# Patient Record
Sex: Male | Born: 1970 | Race: Asian | Hispanic: No | Marital: Married | Smoking: Never smoker
Health system: Southern US, Community
[De-identification: ages and names within clinical notes are randomized; demographics above are authoritative.]

## PROBLEM LIST (undated history)

## (undated) DIAGNOSIS — J309 Allergic rhinitis, unspecified: Secondary | ICD-10-CM

## (undated) DIAGNOSIS — I10 Essential (primary) hypertension: Secondary | ICD-10-CM

## (undated) DIAGNOSIS — J45909 Unspecified asthma, uncomplicated: Secondary | ICD-10-CM

---

## 2018-08-30 DIAGNOSIS — G4733 Obstructive sleep apnea (adult) (pediatric): Secondary | ICD-10-CM | POA: Insufficient documentation

## 2018-08-30 DIAGNOSIS — J4530 Mild persistent asthma, uncomplicated: Secondary | ICD-10-CM | POA: Insufficient documentation

## 2018-08-30 DIAGNOSIS — J309 Allergic rhinitis, unspecified: Secondary | ICD-10-CM | POA: Insufficient documentation

## 2018-12-27 ENCOUNTER — Ambulatory Visit: Payer: Self-pay | Admitting: Family Medicine

## 2019-10-30 DIAGNOSIS — I10 Essential (primary) hypertension: Secondary | ICD-10-CM | POA: Insufficient documentation

## 2019-10-30 DIAGNOSIS — R7303 Prediabetes: Secondary | ICD-10-CM | POA: Insufficient documentation

## 2019-10-30 DIAGNOSIS — I447 Left bundle-branch block, unspecified: Secondary | ICD-10-CM | POA: Insufficient documentation

## 2019-11-04 DIAGNOSIS — D569 Thalassemia, unspecified: Secondary | ICD-10-CM | POA: Insufficient documentation

## 2019-11-11 ENCOUNTER — Emergency Department: Payer: Managed Care, Other (non HMO)

## 2019-11-11 ENCOUNTER — Emergency Department
Admission: EM | Admit: 2019-11-11 | Discharge: 2019-11-11 | Disposition: A | Discharge status: Home / Self Care | Payer: Managed Care, Other (non HMO) | Attending: Emergency Medicine | Admitting: Emergency Medicine

## 2019-11-11 ENCOUNTER — Encounter: Payer: Self-pay | Admitting: Emergency Medicine

## 2019-11-11 ENCOUNTER — Other Ambulatory Visit: Payer: Self-pay

## 2019-11-11 DIAGNOSIS — R079 Chest pain, unspecified: Secondary | ICD-10-CM | POA: Diagnosis present

## 2019-11-11 DIAGNOSIS — J45909 Unspecified asthma, uncomplicated: Secondary | ICD-10-CM | POA: Insufficient documentation

## 2019-11-11 DIAGNOSIS — I10 Essential (primary) hypertension: Secondary | ICD-10-CM | POA: Insufficient documentation

## 2019-11-11 HISTORY — DX: Unspecified asthma, uncomplicated: J45.909

## 2019-11-11 HISTORY — DX: Allergic rhinitis, unspecified: J30.9

## 2019-11-11 HISTORY — DX: Essential (primary) hypertension: I10

## 2019-11-11 LAB — BASIC METABOLIC PANEL
Anion gap: 6 (ref 5–15)
BUN: 8 mg/dL (ref 6–20)
CO2: 29 mmol/L (ref 22–32)
Calcium: 9.7 mg/dL (ref 8.9–10.3)
Chloride: 106 mmol/L (ref 98–111)
Creatinine, Ser: 0.67 mg/dL (ref 0.61–1.24)
GFR calc Af Amer: >60 mL/min (ref >60)
GFR calc non Af Amer: >60 mL/min (ref >60)
Glucose, Bld: 114 mg/dL — ABNORMAL HIGH (ref 70–99)
Potassium: 4.1 mmol/L (ref 3.5–5.1)
Sodium: 141 mmol/L (ref 135–145)

## 2019-11-11 LAB — CBC
HCT: 35.7 % — ABNORMAL LOW (ref 39.0–52.0)
Hemoglobin: 11.3 g/dL — ABNORMAL LOW (ref 13.0–17.0)
MCH: 18.2 pg — ABNORMAL LOW (ref 26.0–34.0)
MCHC: 31.7 g/dL (ref 30.0–36.0)
MCV: 57.4 fL — ABNORMAL LOW (ref 80.0–100.0)
Platelets: 332 10*3/uL (ref 150–400)
RBC: 6.22 MIL/uL — ABNORMAL HIGH (ref 4.22–5.81)
RDW: 18.8 % — ABNORMAL HIGH (ref 11.5–15.5)
WBC: 7 10*3/uL (ref 4.0–10.5)
nRBC: 0 % (ref 0.0–0.2)

## 2019-11-11 LAB — TROPONIN I (HIGH SENSITIVITY)
Troponin I (High Sensitivity): 5 ng/L (ref <18)
Troponin I (High Sensitivity): 7 ng/L (ref <18)

## 2019-11-11 MED ORDER — NITROGLYCERIN 0.4 MG SL SUBL
0.4000 mg | SUBLINGUAL_TABLET | SUBLINGUAL | Status: DC | PRN
  Administered 2019-11-11 (×2): 0.4 mg via SUBLINGUAL
  Filled 2019-11-11 (×2): qty 1

## 2019-11-11 MED ORDER — ASPIRIN 81 MG PO CHEW
324.0000 mg | CHEWABLE_TABLET | Freq: Once | ORAL | Status: AC
  Administered 2019-11-11: 324 mg via ORAL
  Filled 2019-11-11: qty 4

## 2019-11-11 MED ORDER — ACETAMINOPHEN 500 MG PO TABS
1000.0000 mg | ORAL_TABLET | Freq: Once | ORAL | Status: AC
  Administered 2019-11-11: 1000 mg via ORAL
  Filled 2019-11-11: qty 2

## 2019-11-11 NOTE — ED Provider Notes (Signed)
[facility] Emergency Department Provider Note  ____________________________________________  Time seen: Approximately 7:52 PM  I have reviewed the triage vital signs and the nursing notes.   HISTORY  Chief Complaint Chest Pain    HPI George Rivera is a 49 y.o. male with a history of asthma no aggravating or alleviating factors.  Not exertional, not pleuritic.  Not positional.  Feels like pressure, nonradiating.  No associated diaphoresis vomiting shortness of breath palpitations dizziness or syncope.  He walks for about an hour each day for exercise, which does not provoke chest pain.  Electronic medical record reviewed, noted that he was recently seen by cardiology due to incidental finding of left bundle branch block.  Plan for echocardiogram and exercise myocardial perfusion study in about 2 weeks.  Takes omeprazole daily.      Past Medical History:  Diagnosis Date  . Allergic rhinitis   . Asthma   . Hypertension      There are no problems to display for this patient.    History reviewed. No pertinent surgical history.   Prior to Admission medications   Not on File  Omeprazole  Simethicone   Allergies Patient has no known allergies.   Family history Left bundle branch block  Social History Social History   Tobacco Use  . Smoking status: Never Smoker  . Smokeless tobacco: Never Used  Substance Use Topics  . Alcohol use: Never  . Drug use: Not on file    Review of Systems  Constitutional:   No fever or chills.  ENT:   No sore throat. No rhinorrhea. Cardiovascular:   Positive chest pain as above without syncope. Respiratory:   No dyspnea or cough. Gastrointestinal:   Negative for abdominal pain, vomiting and diarrhea.  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  ____________________________________________   PHYSICAL EXAM:  VITAL SIGNS: ED Triage Vitals  Enc Vitals Group     BP 11/11/19 1409 (!) 166/105     Pulse Rate 11/11/19 1409 82     Resp 11/11/19 1409 16     Temp 11/11/19 1409 98.8 F (37.1 C)     Temp Source 11/11/19 1409 Oral     SpO2 11/11/19 1409 100 %     Weight 11/11/19 1411 162 lb (73.5 kg)     Height 11/11/19 1411 5\' 3"  (1.6 m)     Head Circumference --      Peak Flow --      Pain Score 11/11/19 1410 5     Pain Loc --      Pain Edu? --      Excl. in GC? --     Vital signs reviewed, nursing assessments reviewed.   Constitutional:   Alert and oriented. Non-toxic appearance. Eyes:   Conjunctivae are normal. EOMI. PERRL. ENT      Head:   Normocephalic and atraumatic.      Nose:   Wearing a mask.      Mouth/Throat:   Wearing a mask.      Neck:   No meningismus. Full ROM. Hematological/Lymphatic/Immunilogical:   No cervical lymphadenopathy. Cardiovascular:   RRR. Symmetric bilateral radial and DP pulses.  No murmurs. Cap refill less than 2 seconds. Respiratory:   Normal respiratory effort without tachypnea/retractions. Breath sounds are clear and equal bilaterally. No wheezes/rales/rhonchi. Gastrointestinal:   Soft and nontender. Non distended. There is  no CVA tenderness.  No rebound, rigidity, or guarding.  Musculoskeletal:   Normal range of motion in all extremities. No joint effusions.  No lower extremity tenderness.  No edema.  Left upper chest wall tender over the third intercostal space at the anterior axillary line reproducing his pain.  No pain with rotator cuff stress. Neurologic:   Normal speech and language.  Motor grossly intact. No acute focal neurologic deficits are appreciated.  Skin:    Skin is warm, dry and intact. No rash noted.  No petechiae, purpura, or bullae.  ____________________________________________    LABS (pertinent positives/negatives) (all labs ordered are listed, but only  abnormal results are displayed) Labs Reviewed  BASIC METABOLIC PANEL - Abnormal; Notable for the following components:      Result Value   Glucose, Bld 114 (*)    All other components within normal limits  CBC - Abnormal; Notable for the following components:   RBC 6.22 (*)    Hemoglobin 11.3 (*)    HCT 35.7 (*)    MCV 57.4 (*)    MCH 18.2 (*)    RDW 18.8 (*)    All other components within normal limits  TROPONIN I (HIGH SENSITIVITY)  TROPONIN I (HIGH SENSITIVITY)   ____________________________________________   EKG  Interpreted by me Normal sinus rhythm rate of 86.  Normal axis, left bundle branch block with repolarization abnormalities.  No STEMI..  ____________________________________________    RADIOLOGY  DG Chest 2 View  Result Date: 11/11/2019 CLINICAL DATA:  Chest pain. EXAM: CHEST - 2 VIEW COMPARISON:  Report from chest radiograph 10/29/2019 (images unavailable). FINDINGS: Shallow inspiration radiographs. Heart size within normal limits. There is no appreciable airspace consolidation. No evidence of pleural effusion or pneumothorax. No acute bony abnormality identified.  Thoracic spondylosis. IMPRESSION: Shallow inspiration radiographs. No evidence of acute cardiopulmonary abnormality. Electronically Signed   By: Kellie Simmering DO   On: 11/11/2019 14:46    ____________________________________________   PROCEDURES Procedures  ____________________________________________  DIFFERENTIAL DIAGNOSIS   Musculoskeletal chest wall pain, non-STEMI, GERD, myocarditis  CLINICAL IMPRESSION / ASSESSMENT AND PLAN / ED COURSE  Medications ordered in the ED: Medications  nitroGLYCERIN (NITROSTAT) SL tablet 0.4 mg (0.4 mg Sublingual Given 11/11/19 1935)  acetaminophen (TYLENOL) tablet 1,000 mg (1,000 mg Oral Given 11/11/19 1924)  aspirin chewable tablet 324 mg (324 mg Oral Given 11/11/19 1925)    Pertinent labs & imaging results that were available during my care of the  patient were reviewed by me and considered in my medical decision making (see chart for details).  George Rivera was evaluated in Emergency Department on 11/11/2019 for the symptoms described in the history of present illness. He was evaluated in the context of the global COVID-19 pandemic, which necessitated consideration that the patient might be at risk for infection with the SARS-CoV-2 virus that causes COVID-19. Institutional protocols and algorithms that pertain to the evaluation of patients at risk for COVID-19 are in a state of rapid change based on information released by regulatory bodies including the CDC and federal and state organizations. These policies and algorithms were followed during the patient's care in the ED.   Patient presents with atypical chest pain, unlikely cardiac.  However with elevated BMI, history of hypertension, will check serial troponins.  If negative I think patient can follow-up with cardiology for outpatient screening work-up as already planned.  In the ED, EKG is consistent with previous findings, chest x-ray unremarkable, initial labs including troponin are normal.  We will  give aspirin and nitroglycerin as a therapeutic trial to see if this relieves his pain.  Recommend he take NSAIDs and use heat therapy at home if second troponin is negative.  Clinical Course as of Nov 10 2025  Mon Nov 11, 2019  2026 Repeat troponin normal, stable for discharge home.   [PS]    Clinical Course User Index [PS] Sharman Cheek, MD     ____________________________________________   FINAL CLINICAL IMPRESSION(S) / ED DIAGNOSES    Final diagnoses:  Nonspecific chest pain     ED Discharge Orders    None      Portions of this note were generated with dragon dictation software. Dictation errors may occur despite best attempts at proofreading.   Sharman Cheek, MD 11/11/19 2027

## 2019-11-11 NOTE — ED Triage Notes (Signed)
Patient presents to the ED with left sided chest pain since yesterday.  Patient reports history of LBBB.  Patient states he took tums and simethicone without relief.  Patient states the pain radiates somewhat to his left side.  Patient states, "I may have pulled a muscle.  I've been walking a lot."  Denies increased pain with movement.

## 2019-11-18 ENCOUNTER — Ambulatory Visit: Payer: Managed Care, Other (non HMO) | Admitting: Cardiology

## 2019-11-18 ENCOUNTER — Encounter: Payer: Self-pay | Admitting: Cardiology

## 2019-11-18 ENCOUNTER — Other Ambulatory Visit: Payer: Self-pay

## 2019-11-18 VITALS — BP 128/78 | HR 84 | Ht 63.0 in | Wt 167.0 lb

## 2019-11-18 DIAGNOSIS — E785 Hyperlipidemia, unspecified: Secondary | ICD-10-CM

## 2019-11-18 DIAGNOSIS — I447 Left bundle-branch block, unspecified: Secondary | ICD-10-CM

## 2019-11-18 DIAGNOSIS — Z9989 Dependence on other enabling machines and devices: Secondary | ICD-10-CM

## 2019-11-18 DIAGNOSIS — I10 Essential (primary) hypertension: Secondary | ICD-10-CM

## 2019-11-18 DIAGNOSIS — G4733 Obstructive sleep apnea (adult) (pediatric): Secondary | ICD-10-CM

## 2019-11-18 DIAGNOSIS — R011 Cardiac murmur, unspecified: Secondary | ICD-10-CM

## 2019-11-18 NOTE — Patient Instructions (Signed)
Medication Instructions:  No medication changes. *If you need a refill on your cardiac medications before your next appointment, please call your pharmacy*   Lab Work: Your physician recommends that you return for lab work in: next few days. You need to have labs done when you are fasting.  You can come Monday through Friday 8:30 am to 12:00 pm and 1:15 to 4:30. You do not need to make an appointment as the order has already been placed. The labs you are going to have done are TSH, LFT and Lipids.  If you have labs (blood work) drawn today and your tests are completely normal, you will receive your results only by: Marland Kitchen MyChart Message (if you have MyChart) OR . A paper copy in the mail If you have any lab test that is abnormal or we need to change your treatment, we will call you to review the results.   Testing/Procedures: Your physician has requested that you have an echocardiogram. Echocardiography is a painless test that uses sound waves to create images of your heart. It provides your doctor with information about the size and shape of your heart and how well your heart's chambers and valves are working. This procedure takes approximately one hour. There are no restrictions for this procedure.  We will order CT coronary calcium score $150  Please call [phone] to schedule   [facility]  [address]  [place], [place] [address]   Follow-Up: At [facility], you and your health needs are our priority.  As part of our continuing mission to provide you with exceptional heart care, we have created designated Provider Care Teams.  These Care Teams include your primary Cardiologist (physician) and Advanced Practice Providers (APPs -  Physician Assistants and Nurse Practitioners) who all work together to provide you with the care you need, when you need it.  We recommend signing up for the patient portal called "MyChart".  Sign up information is provided on this After  Visit Summary.  MyChart is used to connect with patients for Virtual Visits (Telemedicine).  Patients are able to view lab/test results, encounter notes, upcoming appointments, etc.  Non-urgent messages can be sent to your provider as well.   To learn more about what you can do with MyChart, go to [email or web address].    Your next appointment:   6 month(s)  The format for your next appointment:   In Person  Provider:   Belva Crome, MD   Other Instructions  Echocardiogram An echocardiogram is a procedure that uses painless sound waves (ultrasound) to produce an image of the heart. Images from an echocardiogram can provide important information about:  Signs of coronary artery disease (CAD).  Aneurysm detection. An aneurysm is a weak or damaged part of an artery wall that bulges out from the normal force of blood pumping through the body.  Heart size and shape. Changes in the size or shape of the heart can be associated with certain conditions, including heart failure, aneurysm, and CAD.  Heart muscle function.  Heart valve function.  Signs of a past heart attack.  Fluid buildup around the heart.  Thickening of the heart muscle.  A tumor or infectious growth around the heart valves. Tell a health care provider about:  Any allergies you have.  All medicines you are taking, including vitamins, herbs, eye drops, creams, and over-the-counter medicines.  Any blood disorders you have.  Any surgeries you have had.  Any medical conditions you have.  Whether you are pregnant or may be pregnant. What are the risks? Generally, this is a safe procedure. However, problems may occur, including:  Allergic reaction to dye (contrast) that may be used during the procedure. What happens before the procedure? No specific preparation is needed. You may eat and drink normally. What happens during the procedure?   An IV tube may be inserted into one of your veins.  You may  receive contrast through this tube. A contrast is an injection that improves the quality of the pictures from your heart.  A gel will be applied to your chest.  A wand-like tool (transducer) will be moved over your chest. The gel will help to transmit the sound waves from the transducer.  The sound waves will harmlessly bounce off of your heart to allow the heart images to be captured in real-time motion. The images will be recorded on a computer. The procedure may vary among health care providers and hospitals. What happens after the procedure?  You may return to your normal, everyday life, including diet, activities, and medicines, unless your health care provider tells you not to do that. Summary  An echocardiogram is a procedure that uses painless sound waves (ultrasound) to produce an image of the heart.  Images from an echocardiogram can provide important information about the size and shape of your heart, heart muscle function, heart valve function, and fluid buildup around your heart.  You do not need to do anything to prepare before this procedure. You may eat and drink normally.  After the echocardiogram is completed, you may return to your normal, everyday life, unless your health care provider tells you not to do that. This information is not intended to replace advice given to you by your health care provider. Make sure you discuss any questions you have with your health care provider. Document Revised: 08/30/2018 Document Reviewed: 06/11/2016 Elsevier Patient Education  2020 Elsevier Inc.  Coronary Calcium Scan A coronary calcium scan is an imaging test used to look for deposits of plaque in the inner lining of the blood vessels of the heart (coronary arteries). Plaque is made up of calcium, protein, and fatty substances. These deposits of plaque can partly clog and narrow the coronary arteries without producing any symptoms or warning signs. This puts a person at risk for a  heart attack. This test is recommended for people who are at moderate risk for heart disease. The test can find plaque deposits before symptoms develop. Tell a health care provider about:  Any allergies you have.  All medicines you are taking, including vitamins, herbs, eye drops, creams, and over-the-counter medicines.  Any problems you or family members have had with anesthetic medicines.  Any blood disorders you have.  Any surgeries you have had.  Any medical conditions you have.  Whether you are pregnant or may be pregnant. What are the risks? Generally, this is a safe procedure. However, problems may occur, including:  Harm to a pregnant woman and her unborn baby. This test involves the use of radiation. Radiation exposure can be dangerous to a pregnant woman and her unborn baby. If you are pregnant or think you may be pregnant, you should not have this procedure done.  Slight increase in the risk of cancer. This is because of the radiation involved in the test. What happens before the procedure? Ask your health care provider for any specific instructions on how to prepare for this procedure. You may be asked to avoid products that  contain caffeine, tobacco, or nicotine for 4 hours before the procedure. What happens during the procedure?   You will undress and remove any jewelry from your neck or chest.  You will put on a hospital gown.  Sticky electrodes will be placed on your chest. The electrodes will be connected to an electrocardiogram (ECG) machine to record a tracing of the electrical activity of your heart.  You will lie down on a curved bed that is attached to the [place].  You may be given medicine to slow down your heart rate so that clear pictures can be created.  You will be moved into the CT scanner, and the CT scanner will take pictures of your heart. During this time, you will be asked to lie still and hold your breath for 2-3 seconds at a time while each  picture of your heart is being taken. The procedure may vary among health care providers and hospitals. What happens after the procedure?  You can get dressed.  You can return to your normal activities.  It is up to you to get the results of your procedure. Ask your health care provider, or the department that is doing the procedure, when your results will be ready. Summary  A coronary calcium scan is an imaging test used to look for deposits of plaque in the inner lining of the blood vessels of the heart (coronary arteries). Plaque is made up of calcium, protein, and fatty substances.  Generally, this is a safe procedure. Tell your health care provider if you are pregnant or may be pregnant.  Ask your health care provider for any specific instructions on how to prepare for this procedure.  A CT scanner will take pictures of your heart.  You can return to your normal activities after the scan is done. This information is not intended to replace advice given to you by your health care provider. Make sure you discuss any questions you have with your health care provider. Document Revised: 11/27/2018 Document Reviewed: 11/27/2018 Elsevier Patient Education  [place].

## 2019-11-18 NOTE — Progress Notes (Signed)
Cardiology Office Note:    Date:  11/18/2019   ID:  George Rivera, DOB [birth year], MRN [id number]  PCP:  Ellene Route  Cardiologist:  Jenean Lindau, MD   Referring MD: Ellene Route    ASSESSMENT:    1. Essential hypertension   2. Left bundle branch block (LBBB) on electrocardiogram   3. Murmur   4. OSA on CPAP   5. Cardiac murmur    PLAN:    In order of problems listed above:  1. Primary prevention stressed with the patient.  Importance of compliance with diet medication stressed and he vocalized understanding.  He walks on a regular basis and his effort tolerance is good. 2. Essential hypertension: Blood pressure stable.  Salt intake issues were discussed and he understands. 3. We will do lipid blood work to assess screening for cardiovascular stratification purposes.  For the same reason he would also have calcium scoring. 4. Overweight: I advised him to diet appropriately and weight reduction was stressed 5. Cardiac murmur: Echocardiogram will be done to assess murmur heard on auscultation. 6. Sleep apnea: Sleep health issues were discussed. 7. Patient will be seen in follow-up appointment in 3 months or earlier if the patient has any concerns    Medication Adjustments/Labs and Tests Ordered: Current medicines are reviewed at length with the patient today.  Concerns regarding medicines are outlined above.  Orders Placed This Encounter  Procedures   CT CARDIAC SCORING   TSH   Hepatic function panel   Lipid panel   ECHOCARDIOGRAM COMPLETE   No orders of the defined types were placed in this encounter.    History of Present Illness:    George Rivera is a 49 y.o. male who is being seen today for the evaluation of EKG abnormality and essential hypertension at the request of [place], Bessie.  Patient is a pleasant 49 year old male.  He has past medical history of essential hypertension and bronchial asthma.  Patient mentions to me that he has  had a left bundle-branch-like abnormality for the past several years on his EKG.  He has seen a cardiologist in the past for the same reason.  He is asymptomatic from a cardiovascular standpoint.  He walks 3 to 4 miles a day on a regular basis.  He has post polio syndrome.  No chest pain orthopnea or PND.  At the time of my evaluation, the patient is alert awake oriented and in no distress.  Past Medical History:  Diagnosis Date   Allergic rhinitis    Asthma    Hypertension     History reviewed. No pertinent surgical history.  Current Medications: Current Meds  Medication Sig   albuterol (VENTOLIN HFA) 108 (90 Base) MCG/ACT inhaler Inhale into the lungs.   budesonide (PULMICORT FLEXHALER) 180 MCG/ACT inhaler Inhale into the lungs.   levocetirizine (XYZAL) 5 MG tablet Take by mouth.   lisinopril (ZESTRIL) 20 MG tablet Take by mouth.   montelukast (SINGULAIR) 10 MG tablet Take 1 tablet by mouth at bedtime.   triamcinolone (NASACORT) 55 MCG/ACT AERO nasal inhaler Place into the nose.     Allergies:   Patient has no known allergies.   Social History   Socioeconomic History   Marital status: Married    Spouse name: Not on file   Number of children: Not on file   Years of education: Not on file   Highest education level: Not on file  Occupational History   Not on file  Tobacco Use   Smoking  status: Never Smoker   Smokeless tobacco: Never Used  Substance and Sexual Activity   Alcohol use: Never   Drug use: Not on file   Sexual activity: Not on file  Other Topics Concern   Not on file  Social History Narrative   Not on file   Social Determinants of Health   Financial Resource Strain:    Difficulty of Paying Living Expenses:   Food Insecurity:    Worried About Programme researcher, broadcasting/film/video in the Last Year:    Barista in the Last Year:   Transportation Needs:    Freight forwarder (Medical):    Lack of Transportation (Non-Medical):   Physical  Activity:    Days of Exercise per Week:    Minutes of Exercise per Session:   Stress:    Feeling of Stress :   Social Connections:    Frequency of Communication with Friends and Family:    Frequency of Social Gatherings with Friends and Family:    Attends Religious Services:    Active Member of Clubs or Organizations:    Attends Banker Meetings:    Marital Status:      Family History: The patient's family history is not on file.  ROS:   Please see the history of present illness.    All other systems reviewed and are negative.  EKGs/Labs/Other Studies Reviewed:    The following studies were reviewed today: EKG reveals sinus rhythm and intraventricular conduction delay left ventricular hypertrophy.   Recent Labs: 11/11/2019: BUN 8; Creatinine, Ser 0.67; Hemoglobin 11.3; Platelets 332; Potassium 4.1; Sodium 141  Recent Lipid Panel No results found for: CHOL, TRIG, HDL, CHOLHDL, VLDL, LDLCALC, LDLDIRECT  Physical Exam:    VS:  BP 128/78    Pulse 84    Ht 5\' 3"  (1.6 m)    Wt 167 lb (75.8 kg)    SpO2 98%    BMI 29.58 kg/m     Wt Readings from Last 3 Encounters:  11/18/19 167 lb (75.8 kg)  11/11/19 162 lb (73.5 kg)     GEN: Patient is in no acute distress HEENT: Normal NECK: No JVD; No carotid bruits LYMPHATICS: No lymphadenopathy CARDIAC: S1 S2 regular, 2/6 systolic murmur at the apex. RESPIRATORY:  Clear to auscultation without rales, wheezing or rhonchi  ABDOMEN: Soft, non-tender, non-distended MUSCULOSKELETAL:  No edema; No deformity  SKIN: Warm and dry NEUROLOGIC:  Alert and oriented x 3 PSYCHIATRIC:  Normal affect    Signed, 11/13/19, MD  11/18/2019 2:41 PM    [facility]

## 2019-11-22 ENCOUNTER — Other Ambulatory Visit: Payer: Self-pay

## 2019-11-22 ENCOUNTER — Ambulatory Visit (INDEPENDENT_AMBULATORY_CARE_PROVIDER_SITE_OTHER)
Admission: RE | Admit: 2019-11-22 | Discharge: 2019-11-22 | Disposition: A | Discharge status: Home / Self Care | Payer: Self-pay | Source: Ambulatory Visit | Attending: Cardiology | Admitting: Cardiology

## 2019-11-22 DIAGNOSIS — I10 Essential (primary) hypertension: Secondary | ICD-10-CM

## 2019-11-22 DIAGNOSIS — I447 Left bundle-branch block, unspecified: Secondary | ICD-10-CM

## 2019-11-27 ENCOUNTER — Ambulatory Visit (HOSPITAL_BASED_OUTPATIENT_CLINIC_OR_DEPARTMENT_OTHER)
Admission: RE | Admit: 2019-11-27 | Discharge: 2019-11-27 | Disposition: A | Discharge status: Home / Self Care | Payer: Managed Care, Other (non HMO) | Source: Ambulatory Visit | Attending: Cardiology | Admitting: Cardiology

## 2019-11-27 ENCOUNTER — Other Ambulatory Visit: Payer: Self-pay

## 2019-11-27 DIAGNOSIS — R011 Cardiac murmur, unspecified: Secondary | ICD-10-CM | POA: Diagnosis not present

## 2019-11-27 NOTE — Progress Notes (Signed)
  Echocardiogram 2D Echocardiogram has been performed.  Gerda Diss 11/27/2019, 3:43 PM

## 2019-12-02 ENCOUNTER — Telehealth: Payer: Self-pay

## 2019-12-02 ENCOUNTER — Other Ambulatory Visit: Payer: Managed Care, Other (non HMO)

## 2019-12-02 DIAGNOSIS — I5189 Other ill-defined heart diseases: Secondary | ICD-10-CM

## 2019-12-03 ENCOUNTER — Telehealth: Payer: Self-pay | Admitting: Cardiology

## 2019-12-03 NOTE — Telephone Encounter (Signed)
Left a detailed message for pt that I have placed the order for his cardiac MRI and it has been sent to preop.

## 2019-12-03 NOTE — Telephone Encounter (Signed)
Spoke with patient regarding Cardiac MRI ordered by Dr. Delories Heinz states he would like to have this done ASAP--I explained we had to wait to hear from his insurance company regarding the prior authorization---once we have that in place, we can schedule his appt.  He voiced his understanding.

## 2019-12-04 LAB — HEPATIC FUNCTION PANEL
ALT: 49 IU/L — ABNORMAL HIGH (ref 0–44)
AST: 26 IU/L (ref 0–40)
Albumin: 4.3 g/dL (ref 4.0–5.0)
Alkaline Phosphatase: 75 IU/L (ref 48–121)
Bilirubin Total: 0.6 mg/dL (ref 0.0–1.2)
Bilirubin, Direct: 0.15 mg/dL (ref 0.00–0.40)
Total Protein: 6.9 g/dL (ref 6.0–8.5)

## 2019-12-04 LAB — TSH: TSH: 2.08 u[IU]/mL (ref 0.450–4.500)

## 2019-12-04 LAB — LIPID PANEL
Chol/HDL Ratio: 4.7 ratio (ref 0.0–5.0)
Cholesterol, Total: 197 mg/dL (ref 100–199)
HDL: 42 mg/dL (ref >39)
LDL Chol Calc (NIH): 118 mg/dL — ABNORMAL HIGH (ref 0–99)
Triglycerides: 210 mg/dL — ABNORMAL HIGH (ref 0–149)
VLDL Cholesterol Cal: 37 mg/dL (ref 5–40)

## 2019-12-04 MED ORDER — ROSUVASTATIN CALCIUM 10 MG PO TABS: 10.0000 mg | ORAL_TABLET | Freq: Every day | ORAL | 3 refills | Status: AC

## 2019-12-04 NOTE — Addendum Note (Signed)
Addended by: Eleonore Chiquito on: 12/04/2019 12:06 PM   Modules accepted: Orders

## 2019-12-05 ENCOUNTER — Telehealth: Payer: Self-pay

## 2019-12-05 NOTE — Telephone Encounter (Addendum)
Spoke with pt regarding MRI being scheduled 01/10/20 and that Dr.Revankar would like a TEE before the [place] leaves [place].Pt is aware that the Transesophageal echocardiography (TEE) is a test that produces pictures of your heart. TEE uses high-frequency sound waves (ultrasound) to make detailed pictures of your heart and the arteries that lead to and from it. TEE was offered to assess the fragile mass that was noted on the previous echocardiogram. Pt states that he is leaving on Saturday and he is not interested in delaying his plan/flight. I attempted to call and schedule the TEE for 12/06/19 without success. Dr. Tomie China called and spoke with the pt and advised him to have the TEE but the pt declined. Pt has been advised of the risk of not having additional testing prior to trip. Risk include worsening of symptoms,worsening of health condition and death. Pt continued to decline.

## 2019-12-06 ENCOUNTER — Encounter: Payer: Self-pay | Admitting: Cardiology

## 2019-12-06 NOTE — Telephone Encounter (Signed)
Spoke with patient regarding appointment for Cardiac MRI scheduled Friday 01/10/20 at 9:00 am at [facility]----arrival time is 8:30 am 1st floor admissions office--patient states he is aware of appointment---.will mail information anyway

## 2020-01-08 ENCOUNTER — Telehealth (HOSPITAL_COMMUNITY): Payer: Self-pay | Admitting: Emergency Medicine

## 2020-01-08 NOTE — Telephone Encounter (Signed)
Attempted to call patient regarding upcoming cardiac CT appointment. °Left message on voicemail with name and callback number °Annalisia Ingber RN Navigator Cardiac Imaging °[facility] °[phone] Office °[phone] Cell ° °

## 2020-01-10 ENCOUNTER — Ambulatory Visit (HOSPITAL_COMMUNITY): Payer: Managed Care, Other (non HMO)

## 2020-01-14 ENCOUNTER — Other Ambulatory Visit: Payer: Self-pay

## 2020-01-14 ENCOUNTER — Ambulatory Visit (INDEPENDENT_AMBULATORY_CARE_PROVIDER_SITE_OTHER): Payer: Managed Care, Other (non HMO)

## 2020-01-14 ENCOUNTER — Telehealth: Payer: Self-pay

## 2020-01-14 DIAGNOSIS — R002 Palpitations: Secondary | ICD-10-CM

## 2020-01-14 NOTE — Telephone Encounter (Signed)
Message sent in MyChart.

## 2020-02-13 ENCOUNTER — Encounter: Payer: Self-pay | Admitting: Emergency Medicine

## 2020-02-13 ENCOUNTER — Other Ambulatory Visit: Payer: Self-pay

## 2020-02-13 ENCOUNTER — Emergency Department
Admission: EM | Admit: 2020-02-13 | Discharge: 2020-02-13 | Disposition: A | Discharge status: Home / Self Care | Payer: No Typology Code available for payment source | Attending: Emergency Medicine | Admitting: Emergency Medicine

## 2020-02-13 DIAGNOSIS — I10 Essential (primary) hypertension: Secondary | ICD-10-CM | POA: Diagnosis not present

## 2020-02-13 DIAGNOSIS — Z79899 Other long term (current) drug therapy: Secondary | ICD-10-CM | POA: Insufficient documentation

## 2020-02-13 DIAGNOSIS — W01198A Fall on same level from slipping, tripping and stumbling with subsequent striking against other object, initial encounter: Secondary | ICD-10-CM | POA: Insufficient documentation

## 2020-02-13 DIAGNOSIS — Z7951 Long term (current) use of inhaled steroids: Secondary | ICD-10-CM | POA: Diagnosis not present

## 2020-02-13 DIAGNOSIS — S80212A Abrasion, left knee, initial encounter: Secondary | ICD-10-CM | POA: Insufficient documentation

## 2020-02-13 DIAGNOSIS — J45909 Unspecified asthma, uncomplicated: Secondary | ICD-10-CM | POA: Diagnosis not present

## 2020-02-13 DIAGNOSIS — I2510 Atherosclerotic heart disease of native coronary artery without angina pectoris: Secondary | ICD-10-CM | POA: Insufficient documentation

## 2020-02-13 DIAGNOSIS — S01311A Laceration without foreign body of right ear, initial encounter: Secondary | ICD-10-CM | POA: Insufficient documentation

## 2020-02-13 DIAGNOSIS — Y9289 Other specified places as the place of occurrence of the external cause: Secondary | ICD-10-CM | POA: Insufficient documentation

## 2020-02-13 DIAGNOSIS — W19XXXA Unspecified fall, initial encounter: Secondary | ICD-10-CM

## 2020-02-13 MED ORDER — LIDOCAINE-EPINEPHRINE 2 %-1:100000 IJ SOLN
10.0000 mL | Freq: Once | INTRAMUSCULAR | Status: AC
  Administered 2020-02-13: 10 mL via INTRADERMAL

## 2020-02-13 MED ORDER — CEPHALEXIN 500 MG PO CAPS: 500.0000 mg | ORAL_CAPSULE | Freq: Four times a day (QID) | ORAL | 0 refills | Status: AC

## 2020-02-13 MED ORDER — ACETAMINOPHEN 500 MG PO TABS
1000.0000 mg | ORAL_TABLET | Freq: Once | ORAL | Status: AC
  Administered 2020-02-13: 1000 mg via ORAL
  Filled 2020-02-13: qty 2

## 2020-02-13 NOTE — ED Provider Notes (Signed)
[facility] Emergency Department Provider Note  ____________________________________________   First MD Initiated Contact with Patient 02/13/20 1230     (approximate)  I have reviewed the triage vital signs and the nursing notes.   HISTORY  Chief Complaint Fall and Laceration   HPI George Rivera is a 49 y.o. male with a past medical history of asthma, CAD, and hypertension who presents for assessment of a laceration to his right ear he sustained after he tripped in his office and fell striking a piece of metal on the way down.  Patient did not have LOC and states he scraped his knees but did not have any other injuries from the fall.  He denies pain in his left ear, neck, chest, abdomen, back, upper extremities, or lower extremities with dissection of the knees.  He is otherwise been in his usual state of health without any recent fevers, chills, cough, nausea, vomiting, diarrhea, dysuria, rash, or other falls or injuries.  He states he has had his tetanus updated within the last 5 years.  He is not anticoagulated but does take ASA.         Past Medical History:  Diagnosis Date  . Allergic rhinitis   . Asthma   . Hypertension     Patient Active Problem List   Diagnosis Date Noted  . Cardiac murmur 11/18/2019  . Thalassemia 11/04/2019  . Essential hypertension 10/30/2019  . Left bundle branch block (LBBB) on electrocardiogram 10/30/2019  . Pre-diabetes 10/30/2019  . Allergy-induced asthma, mild persistent, uncomplicated 08/30/2018  . Chronic allergic rhinitis 08/30/2018  . OSA on CPAP 08/30/2018    History reviewed. No pertinent surgical history.  Prior to Admission medications   Medication Sig Start Date End Date Taking? Authorizing Provider  albuterol (VENTOLIN HFA) 108 (90 Base) MCG/ACT inhaler Inhale into the lungs. 08/30/18   [provider]  budesonide (PULMICORT FLEXHALER) 180 MCG/ACT inhaler Inhale into the lungs. 08/30/18   [provider]  cephALEXin (KEFLEX) 500 MG capsule Take 1 capsule (500 mg total) by mouth 4 (four) times daily for 7 days. 02/13/20 02/20/20  Gilles Chiquito, MD  levocetirizine (XYZAL) 5 MG tablet Take by mouth. 10/07/19   [provider]  lisinopril (ZESTRIL) 20 MG tablet Take by mouth. 11/04/19 11/03/20  [provider]  montelukast (SINGULAIR) 10 MG tablet Take 1 tablet by mouth at bedtime. 10/07/19   [provider]  rosuvastatin (CRESTOR) 10 MG tablet Take 1 tablet (10 mg total) by mouth daily. 12/04/19 03/03/20  Revankar, Aundra Dubin, MD  triamcinolone (NASACORT) 55 MCG/ACT AERO nasal inhaler Place into the nose. 05/21/19 05/20/20  [provider]    Allergies Patient has no known allergies.  No family history on file.  Social History Social History   Tobacco Use  . Smoking status: Never Smoker  . Smokeless tobacco: Never Used  Substance Use Topics  . Alcohol use: Never  . Drug use: Not on file    Review of Systems  Review of Systems  Constitutional: Negative for chills and fever.  HENT: Negative for sore throat.   Eyes: Negative for pain.  Respiratory: Negative for cough and stridor.   Cardiovascular: Negative for chest pain.  Gastrointestinal: Negative for vomiting.  Skin: Negative for rash.  Neurological: Negative for seizures, loss of consciousness and headaches.  Psychiatric/Behavioral: Negative for suicidal ideas.  All other systems reviewed and are negative.     ____________________________________________   PHYSICAL EXAM:  VITAL SIGNS: ED Triage  Vitals  Enc Vitals Group     BP 02/13/20 1226 (!) 160/107     Pulse Rate 02/13/20 1226 87     Resp 02/13/20 1226 20     Temp 02/13/20 1227 97.8 F (36.6 C)     Temp Source 02/13/20 1226 Oral     SpO2 02/13/20 1226 100 %     Weight 02/13/20 1229 153 lb (69.4 kg)     Height 02/13/20 1229 5\' 3"  (1.6 m)     Head Circumference --      Peak Flow --      Pain Score --      Pain Loc  --      Pain Edu? --      Excl. in GC? --    Vitals:   02/13/20 1226 02/13/20 1227  BP: (!) 160/107   Pulse: 87   Resp: 20   Temp:  97.8 F (36.6 C)  SpO2: 100%    Physical Exam Vitals and nursing note reviewed.  Constitutional:      Appearance: He is well-developed.  HENT:     Head: Normocephalic and atraumatic.     Right Ear: Tympanic membrane and ear canal normal.     Left Ear: Tympanic membrane, ear canal and external ear normal.     Nose: Nose normal.     Mouth/Throat:     Mouth: Mucous membranes are moist.  Eyes:     Conjunctiva/sclera: Conjunctivae normal.  Cardiovascular:     Rate and Rhythm: Normal rate and regular rhythm.     Pulses: Normal pulses.     Heart sounds: No murmur heard.   Pulmonary:     Effort: Pulmonary effort is normal. No respiratory distress.     Breath sounds: Normal breath sounds.  Abdominal:     Palpations: Abdomen is soft.     Tenderness: There is no abdominal tenderness.  Musculoskeletal:     Cervical back: Neck supple.  Skin:    General: Skin is warm and dry.     Capillary Refill: Capillary refill takes less than 2 seconds.  Neurological:     Mental Status: He is alert and oriented to person, place, and time.  Psychiatric:        Mood and Affect: Mood normal.     Cranial nerves II through XII grossly intact.  Patient does have about 1 cm laceration over the posterior anterior and superior aspect of the right ear that does not cross the perichondrium or cartilage.  No other obvious trauma to the ear head or neck.  Patient is a very minor abrasion over left patella.  There is no effusion deformity Very mild tenderness over the anterior aspect. ____________________________________________   ____________________________________________   PROCEDURES  Procedure(s) performed (including Critical Care):  09/25/21Marland KitchenLaceration Repair  Date/Time: 02/13/2020 12:55 PM Performed by: 02/15/2020, MD Authorized by: Gilles Chiquito, MD    Consent:    Consent obtained:  Verbal   Consent given by:  Patient   Risks discussed:  Need for additional repair, poor cosmetic result, poor wound healing and pain Anesthesia (see MAR for exact dosages):    Anesthesia method:  Local infiltration   Local anesthetic:  Lidocaine 1% WITH epi Laceration details:    Location:  Ear   Ear location:  R ear   Length (cm):  1 Repair type:    Repair type:  Simple Exploration:    Hemostasis achieved with:  Direct pressure   Wound exploration: wound explored  through full range of motion     Contaminated: no   Treatment:    Area cleansed with:  Saline   Amount of cleaning:  Extensive   Irrigation solution:  Sterile saline   Irrigation method:  Syringe   Visualized foreign bodies/material removed: no   Skin repair:    Repair method:  Sutures   Suture size:  6-0   Suture material:  Plain gut   Number of sutures:  6 Approximation:    Approximation:  Close Post-procedure details:    Patient tolerance of procedure:  Tolerated well, no immediate complications     ____________________________________________   INITIAL IMPRESSION / ASSESSMENT AND PLAN / ED COURSE        Patient presents with [place] to history exam for assessment of laceration to his right ear after a ground-level mechanical fall described above.  Patient is hypertensive otherwise stable vital signs on arrival.  Exam as above.  Laceration repaired per procedure note above after discussion with on-call ENT specialist Dr. Andee Poles who reviewed above photo and agreed with care plan.  Low suspicion for cranial nerve injury, skull fracture, intracranial bleeding at this time.  Patient has no tenderness over his C-spine or other evidence of trauma to his back abdomen chest or extremities with exception of a small abrasion over the left knee.  Patient given below noted analgesia.  Discharge stable condition.  Strict return precautions advised and discussed.  Rx written for Keflex.   Instructed to follow-up with ENT or plastic surgery in 3 days for wound check.  ____________________________________________   FINAL CLINICAL IMPRESSION(S) / ED DIAGNOSES  Final diagnoses:  Laceration of right ear lobe, initial encounter  Fall, initial encounter  Abrasion of left knee, initial encounter    Medications  lidocaine-EPINEPHrine (XYLOCAINE W/EPI) 2 %-1:100000 (with pres) injection 10 mL (has no administration in time range)  acetaminophen (TYLENOL) tablet 1,000 mg (1,000 mg Oral Given 02/13/20 1241)     ED Discharge Orders         Ordered    cephALEXin (KEFLEX) 500 MG capsule  4 times daily        02/13/20 1322           Note:  This document was prepared using Dragon voice recognition software and may include unintentional dictation errors.   Gilles Chiquito, MD 02/13/20 1409

## 2020-02-13 NOTE — ED Triage Notes (Signed)
Presents s/p fall  Tripped   Hitting door frame at work   laceration noted to ear

## 2020-02-13 NOTE — ED Notes (Signed)
Conscious alert male. Denies any LOC. States pain 6/10. Suture cart to room

## 2020-02-20 ENCOUNTER — Other Ambulatory Visit: Payer: Self-pay

## 2020-02-20 ENCOUNTER — Encounter: Payer: Self-pay | Admitting: Plastic Surgery

## 2020-02-20 ENCOUNTER — Ambulatory Visit (INDEPENDENT_AMBULATORY_CARE_PROVIDER_SITE_OTHER): Payer: Worker's Compensation | Admitting: Plastic Surgery

## 2020-02-20 VITALS — HR 74 | Temp 98.5°F | Ht 63.0 in | Wt 154.4 lb

## 2020-02-20 DIAGNOSIS — S01311A Laceration without foreign body of right ear, initial encounter: Secondary | ICD-10-CM

## 2020-02-20 NOTE — Progress Notes (Signed)
Referring Provider Nira Retort [address] [place],  [place] [address]   CC:  Chief Complaint  Patient presents with   Advice Only      George Rivera is an 49 y.o. male.  HPI: Patient presents 1 week out from a fall which caused a right ear laceration.  It looks like a laceration occurred through the superior helical rim into the cartilage.  This was then repaired in the emergency room.  He has been concerned about the appearance and wanted my opinion.  He is bothered by the swelling that has occurred along the superior helical rim and the distortion of the ear shape which is bothering him.  No Known Allergies  Outpatient Encounter Medications as of 02/20/2020  Medication Sig   albuterol (VENTOLIN HFA) 108 (90 Base) MCG/ACT inhaler Inhale into the lungs.   budesonide (PULMICORT FLEXHALER) 180 MCG/ACT inhaler Inhale into the lungs.   cephALEXin (KEFLEX) 500 MG capsule Take 1 capsule (500 mg total) by mouth 4 (four) times daily for 7 days.   levocetirizine (XYZAL) 5 MG tablet Take by mouth.   lisinopril (ZESTRIL) 20 MG tablet Take by mouth.   montelukast (SINGULAIR) 10 MG tablet Take 1 tablet by mouth at bedtime.   rosuvastatin (CRESTOR) 10 MG tablet Take 1 tablet (10 mg total) by mouth daily.   triamcinolone (NASACORT) 55 MCG/ACT AERO nasal inhaler Place into the nose.   No facility-administered encounter medications on file as of 02/20/2020.     Past Medical History:  Diagnosis Date   Allergic rhinitis    Asthma    Hypertension     No past surgical history on file.  No family history on file.  Social History   Social History Narrative   Not on file     Review of Systems General: Denies fevers, chills, weight loss CV: Denies chest pain, shortness of breath, palpitations  Physical Exam Vitals with BMI 02/20/2020 02/13/2020 11/18/2019  Height 5\' 3"  5\' 3"  5\' 3"   Weight 154 lbs 6 oz 153 lbs 167 lbs  BMI 27.36 27.11 29.59  Systolic (No Data)  160 128  Diastolic (No Data) 107 78  Pulse 74 87 84    General:  No acute distress,  Alert and oriented, Non-Toxic, Normal speech and affect On examination he has a 2 to 3 cm laceration that looks like it was through and through on his superior helical rim extending from an anterior to posterior direction.  The superior rim is a bit swollen and it appears this is causing some distortion and causing the superior aspect of the ear to be a bit more prominent and displaced laterally from the mastoid.  The remainder the ear looks fine.  There is no purulence or drainage or erythema suggestive of infection.  Assessment/Plan I long discussion with the patient about the ear laceration.  It is possible that at the laceration repair site along the rim that he may have a little bit of inversion of the skin that may or may not settle out as time goes by.  I do think that the swelling that is still present in the superior aspect of the ear will improve with time and then that will allow the cartilage to be restored to its prior shape.  I did explain that in my opinion it would be difficult to perform a revision at this time as there is still swelling and bruising in place from the initial trauma making it difficult to fine tune  the long-term result if any sort of car cartilage work were to be done at this early stage.  I explained that I thought the best thing to do was to wait and allow the healing to take place so that if her revision is necessary it could be done with more accuracy.  We discussed scar creams and I recommended silicone based scar ointments.  I will plan to reevaluate this in 3 months to see how he is doing.  If he would like to see me sooner that be totally fine.  Allena Napoleon 02/20/2020, 4:49 PM

## 2020-03-09 ENCOUNTER — Ambulatory Visit: Payer: Managed Care, Other (non HMO) | Admitting: Podiatry

## 2020-03-09 ENCOUNTER — Other Ambulatory Visit: Payer: Self-pay

## 2020-03-09 ENCOUNTER — Encounter: Payer: Self-pay | Admitting: Podiatry

## 2020-03-09 DIAGNOSIS — B91 Sequelae of poliomyelitis: Secondary | ICD-10-CM

## 2020-03-09 DIAGNOSIS — M21371 Foot drop, right foot: Secondary | ICD-10-CM | POA: Diagnosis not present

## 2020-03-09 DIAGNOSIS — M2170 Unequal limb length (acquired), unspecified site: Secondary | ICD-10-CM

## 2020-03-09 DIAGNOSIS — M6281 Muscle weakness (generalized): Secondary | ICD-10-CM

## 2020-03-09 NOTE — Progress Notes (Signed)
  Subjective:  Patient ID: George Rivera, male    DOB: [birth year],  MRN: [id number]  Chief Complaint  Patient presents with  . Foot Pain    Patient presents today to discuss shoes     49 y.o. male presents with the above complaint. History confirmed with patient.  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  This is left him with weakness of the muscles in the right leg and a limit discrepancy as well.  He has seen orthotist in pedorthist for this previously in [place] who fashioned him a heel lift which he wears in his right shoe and hightop boots.  He can only find certain types of boots to wear that do not cause him to trip and fall.  He recently had multiple falls of the worst of which left him with a laceration requiring plastic surgery on his ear.  He is here today for recommendations on certain types of shoe gear and if there is anything new to stop this.  He has previously discussed surgical invention for this as well to straighten out his foot but cannot take the required recovery time with his job at [employer].  Objective:  Physical Exam: warm, good capillary refill, no trophic changes or ulcerative lesions and normal DP and PT pulses. He has a 2.5 cm limb discrepancy with the right leg shorter than the left.  On the left lower extremity he has 5 out of 5 muscle strength in all planes, on the right lower extremity he has 4+ out of 5 eversion and plantarflexion strength and 4- out of 5 dorsiflexion and inversion strength.  Gait analysis performed both with and without shoes and he has significant extensor substitution on the right lower extremity with upgoing hallux on the swing phase, circumductory gait to swing the right leg around in order to clear the ground and significant hip imbalance when he is without shoes and the heel lift Assessment:   1. Post-polio limb muscle weakness   2. Foot drop, right   3. Lower limb length difference       Plan:  Patient was evaluated and treated and all questions answered.  -Discussed with the patient and his wife how his muscle weakness in his leg discrepancy contributes to significant abnormalities in his gait.  I do not have a particular brand, type or style of shoe that I think would be best for him.  Instead I feel that the foot drop and muscle weakness is causing significant gait abnormalities, this is complicated by his significant 1 inch limb length discrepancy.  I would recommend that we try to support him in a foot drop AFO, I wrote a prescription for him to be evaluated at the [facility] clinic for this.  -I discussed with him that should this fail, he could consider surgical intervention including tendon transfer.  If we pursue this further we will get radiographs and consider his options  Return in about 2 months (around 05/09/2020).

## 2020-05-13 ENCOUNTER — Ambulatory Visit: Payer: Managed Care, Other (non HMO) | Admitting: Podiatry

## 2020-05-21 ENCOUNTER — Ambulatory Visit (INDEPENDENT_AMBULATORY_CARE_PROVIDER_SITE_OTHER): Payer: Worker's Compensation | Admitting: Plastic Surgery

## 2020-05-21 ENCOUNTER — Other Ambulatory Visit: Payer: Self-pay

## 2020-05-21 ENCOUNTER — Encounter: Payer: Self-pay | Admitting: Plastic Surgery

## 2020-05-21 VITALS — BP 101/69 | HR 69 | Temp 97.7°F | Ht 63.0 in | Wt 130.0 lb

## 2020-05-21 DIAGNOSIS — S01311A Laceration without foreign body of right ear, initial encounter: Secondary | ICD-10-CM | POA: Diagnosis not present

## 2020-05-21 NOTE — Progress Notes (Signed)
   Referring Provider Nira Retort [address] [place],  [place] [address]   CC: No chief complaint on file.     George Rivera is an 49 y.o. male.  HPI: Patient presents several months out from trauma to his right ear. He had a fall while at work which caused a laceration through the skin and cartilage.  When I saw him a few months ago there was a bit of a step-off at the anterior aspect of the superior helical rim that concerned him in addition to the positioning of the cartilage with the ear on that side falling away from his head more than the one on the contralateral side.  Review of Systems General: Denies fevers or chills  Physical Exam Vitals with BMI 05/21/2020 02/20/2020 02/13/2020  Height 5\' 3"  5\' 3"  5\' 3"   Weight 130 lbs 154 lbs 6 oz 153 lbs  BMI 23.03 27.36 27.11  Systolic 101 (No Data) 160  Diastolic 69 (No Data) 107  Pulse 69 74 87    General:  No acute distress,  Alert and oriented, Non-Toxic, Normal speech and affect On examination there is persistent deformity in the superior aspect of the ear falling away from his scalp and looking a bit more prominent than the other side.  This is confined to the superior third of the ear itself.  The step-off in the anterior aspect of the helical rim has smooth out nicely and is not very noticeable.  Assessment/Plan Patient presents with right ear deformity after trauma.  The scarring has softened and I do not think anything would be required for the skin aspect of the trauma.  The cartilage shape has changed as result of this with the ear becoming more prominent in the superior third and I do not see this improving with time.  I have offered him the revision otoplasty to correct this.  I explained I would place sutures in the cartilage to help shape it and also shorten the amount of skin on the posterior aspect to have that help hold it in place.  I explained the general postoperative process and explained that I had do  believe that this would help him although upon close inspection he will probably still be able to notice a difference between the right ear and left ear.  He is content with this and is here with his wife all the questions were answered and we will plan to move forward with scheduling this under local in the office.  05/21/2020, 5:13 PM

## 2020-06-11 ENCOUNTER — Telehealth: Payer: Self-pay | Admitting: Plastic Surgery

## 2020-06-11 NOTE — Telephone Encounter (Signed)
Called patient to obtain workers comp information:  Workers Education officer, environmental #[id number]; [place] for workers comp; phone number [phone] for [employer] [email or web address].

## 2020-07-23 ENCOUNTER — Ambulatory Visit: Payer: Managed Care, Other (non HMO) | Admitting: Plastic Surgery

## 2020-07-24 ENCOUNTER — Telehealth: Payer: Self-pay

## 2020-07-24 NOTE — Telephone Encounter (Signed)
Call returned to pt regarding his question about whether he needs to stop taking his 81 mg ASA for his procedure with Dr. Arita Miss next week. I consulted with Dr. Arita Miss & he does not need to stop taking this ASA. Pt states he only takes it prophylactically. He understands the orders.

## 2020-07-24 NOTE — Telephone Encounter (Signed)
Patient called to see if he needs to stop taking aspirin prior to his procedure with Dr. Arita Miss on 07/30/2020.  If so, when?  Please call.

## 2020-07-30 ENCOUNTER — Encounter: Payer: Self-pay | Admitting: Plastic Surgery

## 2020-07-30 ENCOUNTER — Ambulatory Visit: Payer: Worker's Compensation | Admitting: Plastic Surgery

## 2020-07-30 ENCOUNTER — Other Ambulatory Visit: Payer: Self-pay

## 2020-07-30 VITALS — BP 105/72

## 2020-07-30 DIAGNOSIS — S01311A Laceration without foreign body of right ear, initial encounter: Secondary | ICD-10-CM

## 2020-07-30 NOTE — Progress Notes (Signed)
Operative Note   DATE OF OPERATION: 07/30/2020  LOCATION:    SURGICAL DEPARTMENT: Plastic Surgery  PREOPERATIVE DIAGNOSES: Right ear deformity after trauma  POSTOPERATIVE DIAGNOSES:  same  PROCEDURE:  1. Right otoplasty  SURGEON: Ancil Linsey, MD  ANESTHESIA:  Local  COMPLICATIONS: None.   INDICATIONS FOR PROCEDURE:  The patient, George Rivera is a 50 y.o. male born on 1971-04-04, is here for treatment of right ear deformity after trauma.  He sustained a laceration that involve the cartilage and hit his superior aspect of his right ear was more prominent compared to the left.  We discussed otoplasty with cartilage modification and skin excision and he is interested in moving forward. MRN: [id number]  CONSENT:  Informed consent was obtained directly from the patient. Risks, benefits and alternatives were fully discussed. Specific risks including but not limited to bleeding, infection, hematoma, seroma, scarring, pain, infection, wound healing problems, and need for further surgery were all discussed. The patient did have an ample opportunity to have questions answered to satisfaction.  We specifically discussed potential for persistent contour asymmetries and the challenge of manipulating previously traumatized cartilage and he understands.  DESCRIPTION OF PROCEDURE:  Local anesthesia was administered. The patient's operative site was prepped and draped in a sterile fashion. A time out was performed and all information was confirmed to be correct.  An elliptical excision was performed in the posterior auricular sulcus anticipating removing some amount of skin.  The posterior ear skin was then elevated off the cartilage for exposure.  4-0 nylon sutures were used to imbricate the cartilage to bend it more posteriorly and 4-0 nylon sutures were used to anchor it to the mastoid fascia which significantly improved to the deformity.  At this point I was able to check him for symmetry which  seem to be quite improved.  The skin was then closed with interrupted buried Monocryl sutures and a running 5-0 fast gut.  He tolerated the procedure well.   The patient tolerated the procedure well.  There were no complications.

## 2020-08-12 ENCOUNTER — Other Ambulatory Visit: Payer: Self-pay

## 2020-08-12 ENCOUNTER — Ambulatory Visit (INDEPENDENT_AMBULATORY_CARE_PROVIDER_SITE_OTHER): Payer: Worker's Compensation | Admitting: Plastic Surgery

## 2020-08-12 ENCOUNTER — Encounter: Payer: Self-pay | Admitting: Plastic Surgery

## 2020-08-12 VITALS — BP 116/75 | HR 90

## 2020-08-12 DIAGNOSIS — S01311A Laceration without foreign body of right ear, initial encounter: Secondary | ICD-10-CM

## 2020-08-12 MED ORDER — CELECOXIB 200 MG PO CAPS: 200.0000 mg | ORAL_CAPSULE | Freq: Two times a day (BID) | ORAL | 1 refills | Status: AC

## 2020-08-12 NOTE — Addendum Note (Signed)
Addended by: Allena Napoleon on: 08/12/2020 11:16 AM   Modules accepted: Orders

## 2020-08-12 NOTE — Progress Notes (Signed)
Patient presents postop from right otoplasty.  This was done 2 weeks ago.  He is overall happy but feels like there is been a little bit of swelling and quite a bit of pain posterior and superior to the ear.  On exam his incisions have healed nicely.  I do believe his correction is appropriate and symmetric with the left side.  There does seem like there is some very mild swelling posterior and superior to the ear but certainly no subcutaneous fluid or signs of any infection.  I plan to give him Celebrex to try and help with the pain and inflammation.  I recommend he continue to wear a headband at night for the next month or so.  He also was curious about some neck tightening tightening topical therapy and I provided our Sente neck firming cream to him.  I will plan to see him again in about a month.

## 2020-09-17 ENCOUNTER — Ambulatory Visit (INDEPENDENT_AMBULATORY_CARE_PROVIDER_SITE_OTHER): Payer: Worker's Compensation | Admitting: Plastic Surgery

## 2020-09-17 ENCOUNTER — Other Ambulatory Visit: Payer: Self-pay

## 2020-09-17 DIAGNOSIS — S01311A Laceration without foreign body of right ear, initial encounter: Secondary | ICD-10-CM

## 2020-09-17 NOTE — Progress Notes (Signed)
Patient is here about 2 months postop from right otoplasty.  This was to try to correct an asymmetry due to trauma.  He feels that the results has subsided a bit since his last visit but is still overall better than preop.  On exam his incision is healed nicely there may be a millimeter or 2 more prominence of the right superior helical rim compared to the left but its very subtle at this point.  I did review his preoperative pictures in we all seem to agree that there has been an improvement.  At this point I do not feel that he needs to wear the head wrap anymore and we will plan to watch this for period of time.  If there is further regression of the results with further prominence of the superior helical rim on the right we could consider a revision but at this point I believe he has a reasonably good result.  The patient and his wife are in agreement with this plan and we will plan to see him again on an as-needed basis.

## 2021-03-12 IMAGING — CT CT CARDIAC CORONARY ARTERY CALCIUM SCORE
3 series · 14 of 20 positions shown, 15 images · non-contrast
Comparison: None.
COMPARISON: None.

Addendum:
EXAM:
OVER-READ INTERPRETATION  CT CHEST

The following report is an over-read performed by radiologist Dr.
Jodikay Ranglin [REDACTED] on 11/22/2019. This over-read
does not include interpretation of cardiac or coronary anatomy or
pathology. The coronary calcium score interpretation by the
cardiologist is attached.
CLINICAL DATA: Risk stratification
Coronary Calcium Score
TECHNIQUE: The patient was scanned on a Siemens Force scanner. Axial
non-contrast 3 mm slices were carried out through the heart. The
data set was analyzed on a dedicated work station and scored using
the Agatson method.

[Series 2: casc 3.0 bv41 2 bestdiast 69 % · axial · 0.31mm/px · z∈[-151,-88]mm · 4 of 37 slices shown, 5 images]
[im 8/37  vessel]
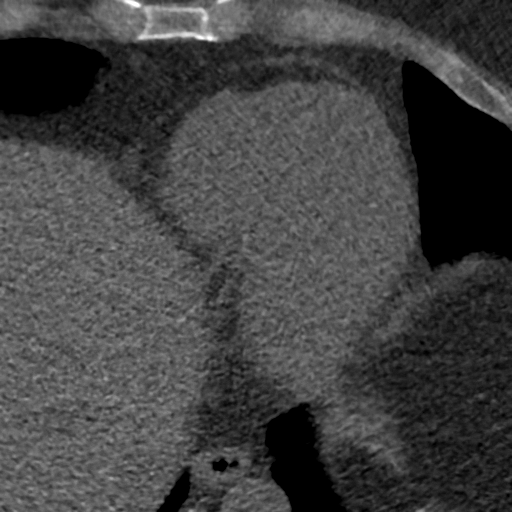
[im 8/37  lung]
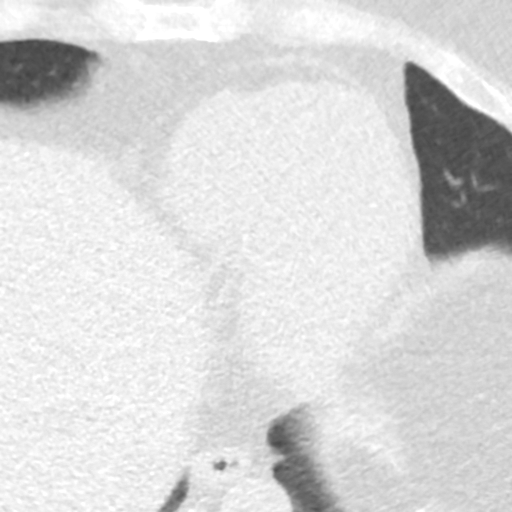
[im 15/37  vessel]
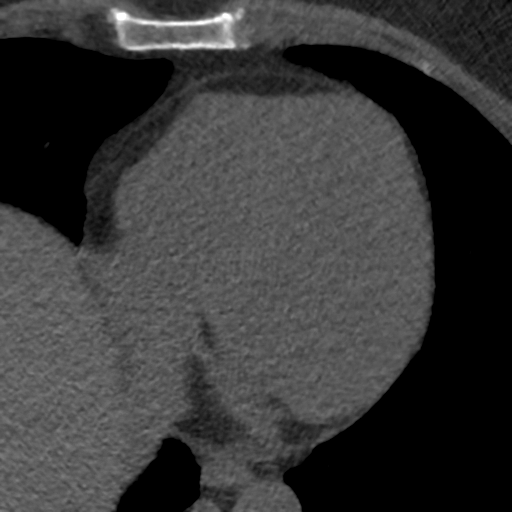
[im 22/37  vessel]
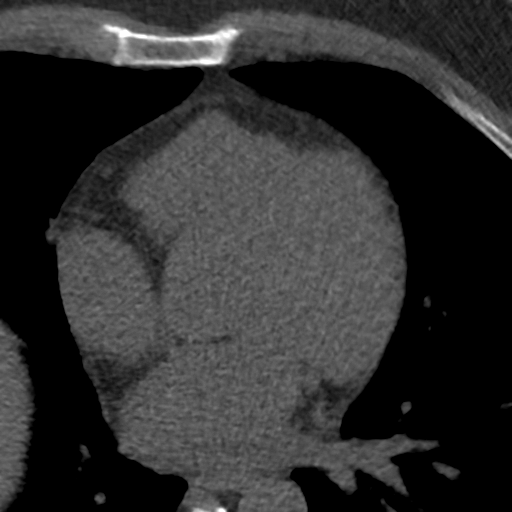
[im 29/37  vessel]
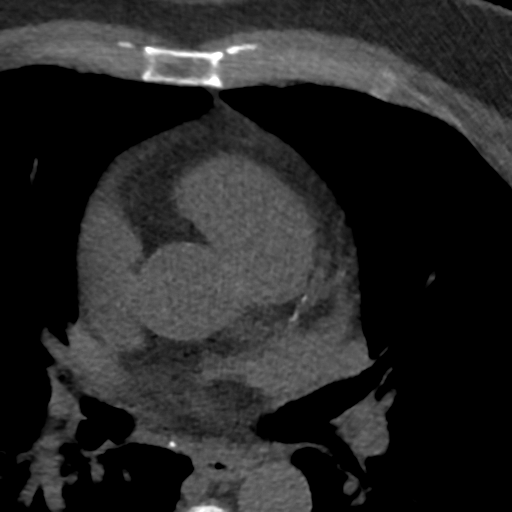

[Series 3: lung 70 % · axial · 0.68mm/px · z∈[-154,-82]mm · 5 of 37 slices shown]
[im 7/37  lung]
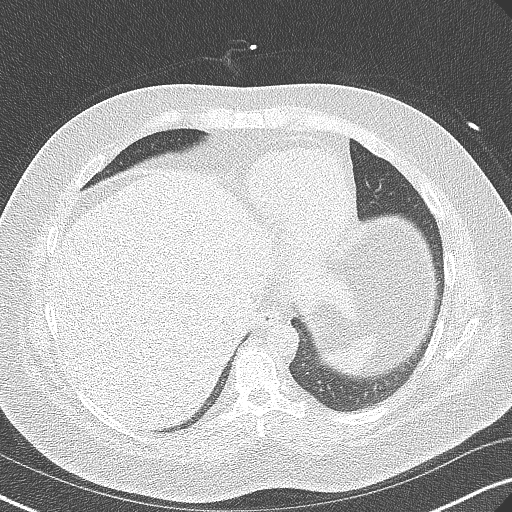
[im 13/37  lung]
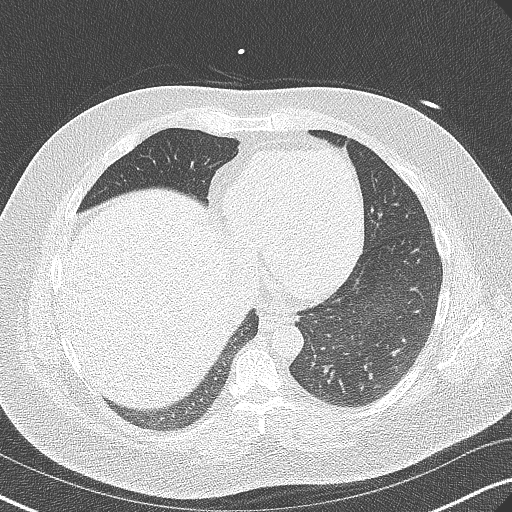
[im 19/37  lung]
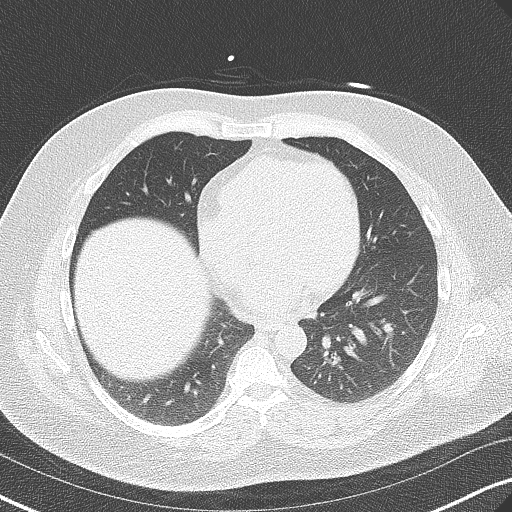
[im 25/37  lung]
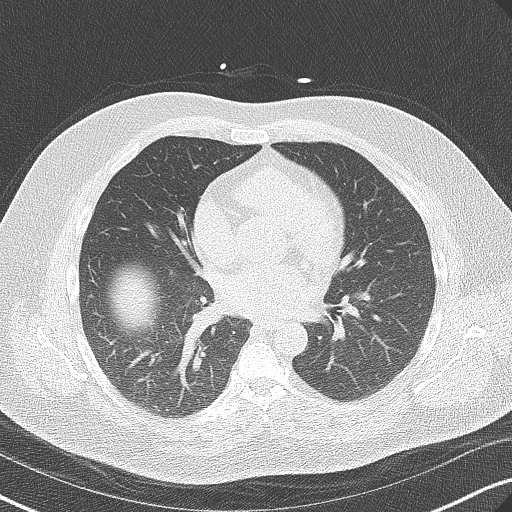
[im 31/37  lung]
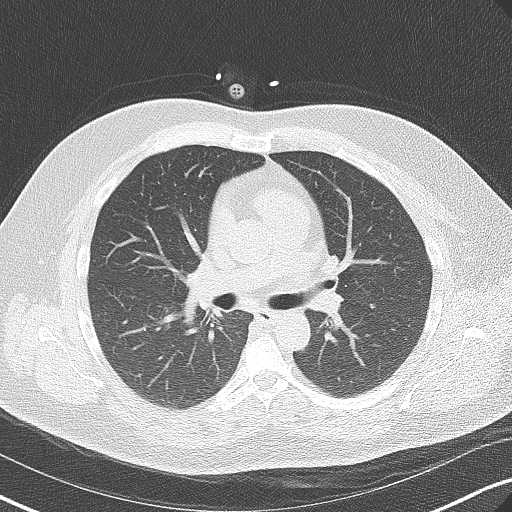

[Series 4: lung st 70 % · axial · 0.68mm/px · z∈[-154,-82]mm · 5 of 37 slices shown]
[im 7/37  lung]
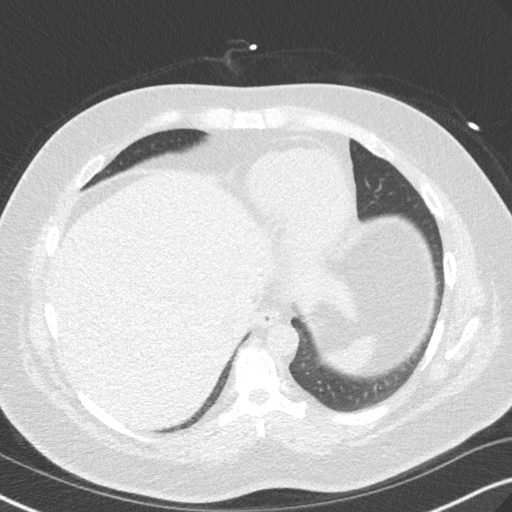
[im 13/37  lung]
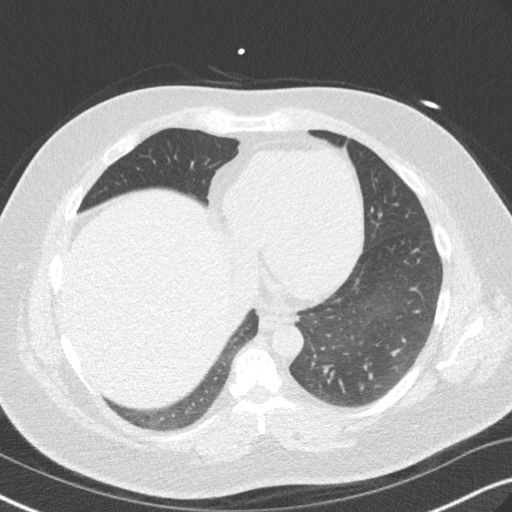
[im 19/37  lung]
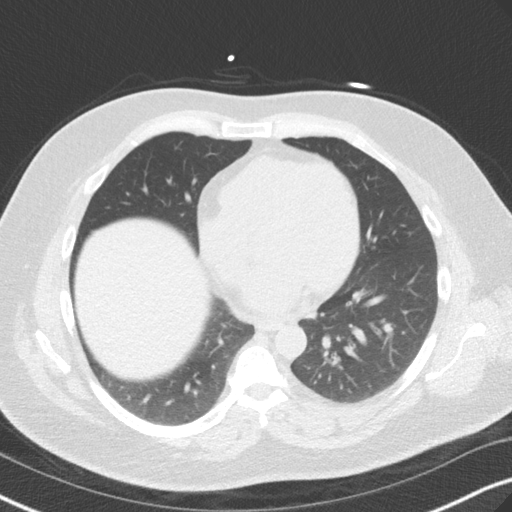
[im 25/37  lung]
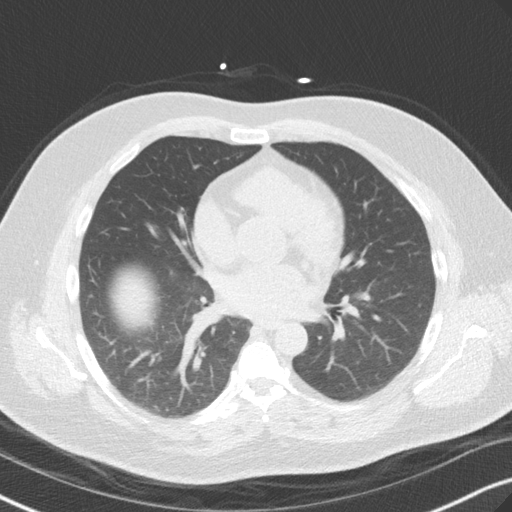
[im 31/37  lung]
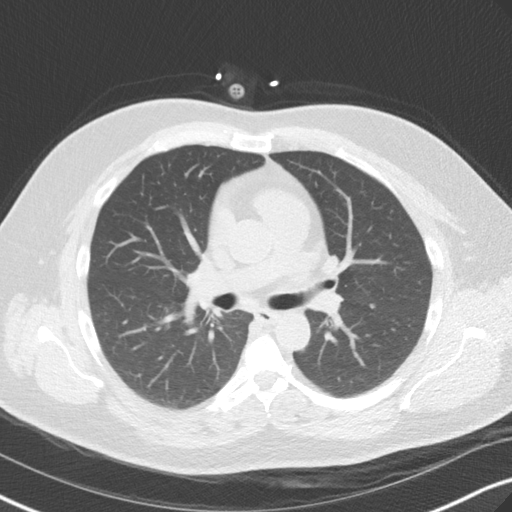

[14 of 20 positions shown; findings below may reference images not displayed]

FINDINGS: Vascular: Heart is normal size.  Visualized aorta normal caliber.

Mediastinum/Nodes: No adenopathy

Lungs/Pleura: Visualized lungs clear.  No effusions.

Upper Abdomen: Imaging into the upper abdomen shows no acute
findings.

Musculoskeletal: Chest wall soft tissues are unremarkable. No acute
bony abnormality.
IMPRESSION: No acute or significant extra cardiac abnormality.
FINDINGS: Non-cardiac: See separate report from [REDACTED].

Ascending Aorta: Normal

Pericardium: Normal

Coronary arteries: Normal origin.
IMPRESSION: Coronary calcium score of 84 (all present in the LAD) . This was 96
percentile for age and sex matched control.

Abe Astudillo,DO

*** End of Addendum ***
EXAM:
OVER-READ INTERPRETATION  CT CHEST

The following report is an over-read performed by radiologist Dr.
Jodikay Ranglin [REDACTED] on 11/22/2019. This over-read
does not include interpretation of cardiac or coronary anatomy or
pathology. The coronary calcium score interpretation by the
cardiologist is attached.
FINDINGS: Vascular: Heart is normal size.  Visualized aorta normal caliber.

Mediastinum/Nodes: No adenopathy

Lungs/Pleura: Visualized lungs clear.  No effusions.

Upper Abdomen: Imaging into the upper abdomen shows no acute
findings.

Musculoskeletal: Chest wall soft tissues are unremarkable. No acute
bony abnormality.
IMPRESSION: No acute or significant extra cardiac abnormality.

## 2022-10-20 ENCOUNTER — Ambulatory Visit: Payer: Managed Care, Other (non HMO)

## 2022-10-20 DIAGNOSIS — K5730 Diverticulosis of large intestine without perforation or abscess without bleeding: Secondary | ICD-10-CM

## 2022-10-20 DIAGNOSIS — K640 First degree hemorrhoids: Secondary | ICD-10-CM

## 2022-10-20 DIAGNOSIS — D124 Benign neoplasm of descending colon: Secondary | ICD-10-CM

## 2022-10-20 DIAGNOSIS — Z1211 Encounter for screening for malignant neoplasm of colon: Secondary | ICD-10-CM | POA: Diagnosis not present
